# Patient Record
Sex: Male | Born: 1990 | Race: White | Hispanic: No | Marital: Single | Smoking: Never smoker
Health system: Southern US, Community
[De-identification: ages and names within clinical notes are randomized; demographics above are authoritative.]

## PROBLEM LIST (undated history)

## (undated) HISTORY — PX: WISDOM TOOTH EXTRACTION: SHX21

---
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
## 2012-08-30 ENCOUNTER — Ambulatory Visit (INDEPENDENT_AMBULATORY_CARE_PROVIDER_SITE_OTHER): Payer: Managed Care, Other (non HMO)

## 2012-08-30 ENCOUNTER — Ambulatory Visit (INDEPENDENT_AMBULATORY_CARE_PROVIDER_SITE_OTHER): Payer: Managed Care, Other (non HMO) | Admitting: Family Medicine

## 2012-08-30 DIAGNOSIS — M25539 Pain in unspecified wrist: Secondary | ICD-10-CM

## 2012-08-30 DIAGNOSIS — M25531 Pain in right wrist: Secondary | ICD-10-CM

## 2012-08-30 NOTE — Progress Notes (Signed)
  Subjective:    Patient ID: Hector Mcmillan, male    DOB: Mar 30, 1990, 22 y.o.   MRN: [id number]  HPI Wrist pain x 2 days Pt was stepping off boat when he felt pull and pop on ulnar side of R wrist.  Has had mild pain since this point.  Worse with wrist supination and ulnar deviation.  No numbness or paresthesias.  Grip strength and ROM intact.     Review of Systems  All other systems reviewed and are negative.       Objective:   Physical Exam  Constitutional: He appears well-developed and well-nourished.  HENT:  Head: Normocephalic and atraumatic.  Eyes: Conjunctivae are normal. Pupils are equal, round, and reactive to light.  Neck: Normal range of motion.  Cardiovascular: Normal rate and regular rhythm.   Pulmonary/Chest: Effort normal and breath sounds normal.  Abdominal: Soft.  Musculoskeletal:       Arms: R wrist: Full ROM Full strength  + pain with resisted ulnar deviation + pain with resisted supination No TFCC tenderness   Neurological: He is alert.  Skin: Skin is warm.   [facility] reading (PRIMARY) by  Dr. Alvester Morin R wrist xray preliminarily negative for any fracture or dislocation                                        Assessment & Plan:  Suspect ulnar sided wrist extensor sprain. No TFCC tenderness RICE and NSAIDs  Wrist brace  Discussed general and MSK red flags.  Follow up with sports medicine if not improved.

## 2012-08-30 NOTE — Patient Instructions (Signed)
Wrist Pain  Wrist injuries are frequent in adults and children. A sprain is an injury to the ligaments that hold your bones together. A strain is an injury to muscle or muscle cord-like structures (tendons) from stretching or pulling. Generally, when wrists are moderately tender to touch following a fall or injury, a break in the bone (fracture) may be present. Most wrist sprains or strains are better in 3 to 5 days, but complete healing may take several weeks.  HOME CARE INSTRUCTIONS    Put ice on the injured area.   Put ice in a plastic bag.   Place a towel between your skin and the bag.   Leave the ice on for 15-20 minutes, 3-4 times a day, for the first 2 days.   Keep your arm raised above the level of your heart whenever possible to reduce swelling and pain.   Rest the injured area for at least 48 hours or as directed by your caregiver.   If a splint or elastic bandage has been applied, use it for as long as directed by your caregiver or until seen by a caregiver for a follow-up exam.   Only take over-the-counter or prescription medicines for pain, discomfort, or fever as directed by your caregiver.   Keep all follow-up appointments. You may need to follow up with a specialist or have follow-up X-rays. Improvement in pain level is not a guarantee that you did not fracture a bone in your wrist. The only way to determine whether or not you have a broken bone is by X-ray.  SEEK IMMEDIATE MEDICAL CARE IF:    Your fingers are swollen, very red, white, or cold and blue.   Your fingers are numb or tingling.   You have increasing pain.   You have difficulty moving your fingers.  MAKE SURE YOU:    Understand these instructions.   Will watch your condition.   Will get help right away if you are not doing well or get worse.  Document Released: 11/26/2004 Document Revised: 05/11/2011 Document Reviewed: 04/09/2010  [employer] Patient Information 2014 [employer].

## 2013-03-21 ENCOUNTER — Ambulatory Visit (INDEPENDENT_AMBULATORY_CARE_PROVIDER_SITE_OTHER): Payer: 59 | Admitting: Family Medicine

## 2013-03-21 ENCOUNTER — Encounter: Payer: Self-pay | Admitting: Family Medicine

## 2013-03-21 VITALS — BP 122/73 | HR 85 | Temp 98.0°F | Ht 69.0 in | Wt 172.6 lb

## 2013-03-21 DIAGNOSIS — F524 Premature ejaculation: Secondary | ICD-10-CM

## 2013-03-21 DIAGNOSIS — F411 Generalized anxiety disorder: Secondary | ICD-10-CM

## 2013-03-21 MED ORDER — SERTRALINE HCL 50 MG PO TABS: 50.0000 mg | ORAL_TABLET | Freq: Every day | ORAL | Status: DC

## 2013-03-21 NOTE — Progress Notes (Signed)
   Subjective:    Patient ID: Hector Mcmillan, male    DOB: [birth year], 23 y.o.   MRN: [id number]  HPI This 23 y.o. male presents for evaluation of GAD sx's and premature ejaculation.   Review of Systems No chest pain, SOB, HA, dizziness, vision change, N/V, diarrhea, constipation, dysuria, urinary urgency or frequency, myalgias, arthralgias or rash.     Objective:   Physical Exam  Vital signs noted  Well developed well nourished male.  HEENT - Head atraumatic Normocephalic                Eyes - PERRLA, Conjuctiva - clear Sclera- Clear EOMI Respiratory - Lungs CTA bilateral Cardiac - RRR S1 and S2 without murmur GI - Abdomen soft Nontender and bowel sounds active x 4 Extremities - No edema. Neuro - Grossly intact.      Assessment & Plan:  GAD (generalized anxiety disorder) - Plan: sertraline (ZOLOFT) 50 MG tablet Po qd #30w/5rf.  Follow up in one month.  Premature ejaculation - Sertraline 50mg  one po qd #30/5rf.  Deatra CanterWilliam J Demetruis Depaul FNP

## 2013-03-21 NOTE — Patient Instructions (Signed)

## 2013-03-27 ENCOUNTER — Ambulatory Visit: Payer: Managed Care, Other (non HMO) | Admitting: Family Medicine

## 2013-04-03 ENCOUNTER — Encounter: Payer: Self-pay | Admitting: *Deleted

## 2013-04-20 ENCOUNTER — Ambulatory Visit: Payer: 59 | Admitting: Family Medicine

## 2013-04-21 ENCOUNTER — Ambulatory Visit: Payer: 59 | Admitting: Family Medicine

## 2013-05-31 ENCOUNTER — Ambulatory Visit (INDEPENDENT_AMBULATORY_CARE_PROVIDER_SITE_OTHER): Payer: 59 | Admitting: Family Medicine

## 2013-05-31 VITALS — BP 120/75 | HR 93 | Temp 98.7°F | Ht 69.0 in | Wt 173.4 lb

## 2013-05-31 DIAGNOSIS — F411 Generalized anxiety disorder: Secondary | ICD-10-CM

## 2013-05-31 MED ORDER — PAROXETINE HCL 20 MG PO TABS: 20.0000 mg | ORAL_TABLET | Freq: Every day | ORAL | Status: DC

## 2013-05-31 NOTE — Progress Notes (Signed)
   Subjective:    Patient ID: Tillman AbideSteven B Fleet, male    DOB: November 29, 1990, 23 y.o.   MRN: [id number]  HPI This 23 y.o. male presents for evaluation of GAD sx's and premature ejaculation. He was taking zoloft and it is not working  Review of Systems No chest pain, SOB, HA, dizziness, vision change, N/V, diarrhea, constipation, dysuria, urinary urgency or frequency, myalgias, arthralgias or rash.     Objective:   Physical Exam Vital signs noted  Well developed well nourished male.  HEENT - Head atraumatic Normocephalic                Eyes - PERRLA, Conjuctiva - clear Sclera- Clear EOMI                Ears - EAC's Wnl TM's Wnl Gross Hearing WNL                Nose - Nares patent                 Throat - oropharanx wnl Respiratory - Lungs CTA bilateral Cardiac - RRR S1 and S2 without murmur GI - Abdomen soft Nontender and bowel sounds active x 4 Extremities - No edema..  Neuro - Grossly intact.       Assessment & Plan:  GAD (generalized anxiety disorder) - Plan: PARoxetine (PAXIL) 20 MG tablet Deatra CanterWilliam J Oxford FNP

## 2013-06-05 ENCOUNTER — Telehealth: Payer: Self-pay | Admitting: Family Medicine

## 2013-06-05 ENCOUNTER — Ambulatory Visit: Payer: 59 | Admitting: Family Medicine

## 2013-06-05 NOTE — Telephone Encounter (Signed)
appt given for today at 2:20

## 2013-06-30 ENCOUNTER — Ambulatory Visit: Payer: 59 | Admitting: Family Medicine

## 2013-12-28 ENCOUNTER — Telehealth: Payer: Self-pay | Admitting: Family Medicine

## 2013-12-28 NOTE — Telephone Encounter (Signed)
STP he has had burning with urination x 1 week, the burning is towards the end of urinating, pt declines any foul odor or blood in the urine, no other symptoms, pt declines earlier appts, he requested Monday morning. Pt given appt Monday morning with Ander SladeBill Oxford at 8:45

## 2014-01-01 ENCOUNTER — Ambulatory Visit: Payer: Self-pay | Admitting: Family Medicine

## 2014-01-08 ENCOUNTER — Encounter: Payer: Self-pay | Admitting: Family Medicine

## 2014-01-08 ENCOUNTER — Ambulatory Visit (INDEPENDENT_AMBULATORY_CARE_PROVIDER_SITE_OTHER): Payer: 59 | Admitting: Family Medicine

## 2014-01-08 VITALS — BP 133/81 | HR 102 | Temp 96.9°F | Ht 69.0 in | Wt 182.0 lb

## 2014-01-08 DIAGNOSIS — R300 Dysuria: Secondary | ICD-10-CM

## 2014-01-08 DIAGNOSIS — B029 Zoster without complications: Secondary | ICD-10-CM

## 2014-01-08 DIAGNOSIS — N390 Urinary tract infection, site not specified: Secondary | ICD-10-CM

## 2014-01-08 DIAGNOSIS — Z139 Encounter for screening, unspecified: Secondary | ICD-10-CM

## 2014-01-08 LAB — POCT UA - MICROSCOPIC ONLY
BACTERIA, U MICROSCOPIC: NEGATIVE
Casts, Ur, LPF, POC: NEGATIVE
Crystals, Ur, HPF, POC: NEGATIVE
Mucus, UA: NEGATIVE
Yeast, UA: NEGATIVE

## 2014-01-08 LAB — POCT WET PREP (WET MOUNT)

## 2014-01-08 LAB — POCT URINALYSIS DIPSTICK
BILIRUBIN UA: NEGATIVE
GLUCOSE UA: NEGATIVE
Ketones, UA: NEGATIVE
NITRITE UA: NEGATIVE
Protein, UA: NEGATIVE
Spec Grav, UA: 1.01
Urobilinogen, UA: NEGATIVE
pH, UA: 5

## 2014-01-08 MED ORDER — DOXYCYCLINE HYCLATE 100 MG PO TABS: 100.0000 mg | ORAL_TABLET | Freq: Two times a day (BID) | ORAL | Status: DC

## 2014-01-08 NOTE — Patient Instructions (Addendum)
Drink lots of fluids, especially water Avoid soaps fabric softeners and detergents that have a lot of fragrance Take antibiotic as directed Take antibiotic as directed

## 2014-01-08 NOTE — Progress Notes (Signed)
Subjective:    Patient ID: Hector Mcmillan, male    DOB: 1990/04/19, 23 y.o.   MRN: [id number]  HPI Pt here for burning with urination x 3-4 weeks.the patient has no history of kidney stones nor is there any family history of kidney stones. He denies abdominal pain and back pain. He denies any swelling in the testicles. This burning is worse before going to bed in the morning after working third shift. It is not there all the time. He is sexually active. He denies any discharge.   Review of Systems  Constitutional: Negative.   HENT: Negative.   Eyes: Negative.   Respiratory: Negative.   Cardiovascular: Negative.   Gastrointestinal: Negative.   Endocrine: Negative.   Genitourinary: Positive for dysuria.  Musculoskeletal: Negative.   Skin: Negative.   Allergic/Immunologic: Negative.   Neurological: Negative.   Hematological: Negative.   Psychiatric/Behavioral: Negative.          There are no active problems to display for this patient.  Outpatient Encounter Prescriptions as of 01/08/2014  Medication Sig  . PARoxetine (PAXIL) 20 MG tablet Take 1 tablet (20 mg total) by mouth daily.       Objective:   Physical Exam  Constitutional: He is oriented to person, place, and time. He appears well-developed and well-nourished. No distress.  HENT:  Head: Normocephalic.  Eyes: Conjunctivae and EOM are normal. Pupils are equal, round, and reactive to light. Right eye exhibits no discharge. Left eye exhibits no discharge. No scleral icterus.  Neck: Normal range of motion.  Abdominal: Soft. Bowel sounds are normal. He exhibits no distension and no mass. There is no tenderness. There is no rebound and no guarding.  Genitourinary: Rectum normal and penis normal. No penile tenderness.  The external genitalia were normal without swelling or tenderness. There were no inguinal nodes. Rectal exam had a slightly enlarged and congested prostate. There were no prostate masses and no rectal masses.    Musculoskeletal: Normal range of motion.  Neurological: He is alert and oriented to person, place, and time.  Skin: Skin is warm and dry. No rash noted. No erythema. No pallor.  Psychiatric: He has a normal mood and affect. His behavior is normal. Judgment and thought content normal.    BP 133/81 mmHg  Pulse 102  Temp(Src) 96.9 F (36.1 C) (Oral)  Ht 5\' 9"  (1.753 m)  Wt 182 lb (82.555 kg)  BMI 26.86 kg/m2  Results for orders placed or performed in visit on 01/08/14  POCT urinalysis dipstick  Result Value Ref Range   Color, UA yellow    Clarity, UA clear    Glucose, UA neg    Bilirubin, UA neg    Ketones, UA neg    Spec Grav, UA 1.010    Blood, UA trace    pH, UA 5.0    Protein, UA neg    Urobilinogen, UA negative    Nitrite, UA neg    Leukocytes, UA Trace   POCT UA - Microscopic Only  Result Value Ref Range   WBC, Ur, HPF, POC 5-10    RBC, urine, microscopic 5-10    Bacteria, U Microscopic neg    Mucus, UA neg    Epithelial cells, urine per micros occ    Crystals, Ur, HPF, POC neg    Casts, Ur, LPF, POC neg    Yeast, UA neg   POCT Wet Prep [facility]  Result Value Ref Range   WBC, Wet Prep HPF POC 1-3  Bacteria Wet Prep HPF POC occ    Clue Cells Wet Prep HPF POC None    Yeast Wet Prep HPF POC None    Trichomonas Wet Prep HPF POC none    The urine will also be screened for chlamydia and GC.--- The patient was made aware of this.       Assessment & Plan:   1. Dysuria - POCT urinalysis dipstick - POCT UA - Microscopic Only - Urine culture - doxycycline (VIBRA-TABS) 100 MG tablet; Take 1 tablet (100 mg total) by mouth 2 (two) times daily.  Dispense: 60 tablet; Refill: 0 - POCT Wet Prep [facility]([place]  2. UTI (lower urinary tract infection) - doxycycline (VIBRA-TABS) 100 MG tablet; Take 1 tablet (100 mg total) by mouth 2 (two) times daily.  Dispense: 60 tablet; Refill: 0 - POCT Wet Prep [employer]([place])  3. Screening  4. Herpes zoster  Patient Instructions   Drink lots of fluids, especially water Avoid soaps fabric softeners and detergents that have a lot of fragrance Take antibiotic as directed Take antibiotic as directed   Nyra Capeson W. Moore MD

## 2014-01-08 NOTE — Addendum Note (Signed)
Addended by: Orma RenderHODGES, Elizandro Laura F on: 01/08/2014 11:39 AM   Modules accepted: Orders

## 2014-01-09 LAB — URINE CULTURE: ORGANISM ID, BACTERIA: NO GROWTH

## 2014-01-09 LAB — HEPATITIS C ANTIBODY: Hep C Virus Ab: <0.1 s/co ratio (ref 0.0–0.9)

## 2014-01-09 LAB — HIV ANTIBODY (ROUTINE TESTING W REFLEX)
HIV 1/O/2 Abs-Index Value: <1 (ref <1.00)
HIV-1/HIV-2 Ab: NONREACTIVE

## 2014-01-09 LAB — HERPES SIMPLEX TYPING

## 2014-01-10 LAB — SPECIMEN STATUS REPORT

## 2014-01-11 ENCOUNTER — Telehealth: Payer: Self-pay | Admitting: *Deleted

## 2014-01-11 LAB — GC/CHLAMYDIA PROBE AMP
Chlamydia trachomatis, NAA: POSITIVE — AB
Neisseria gonorrhoeae by PCR: NEGATIVE

## 2014-01-11 LAB — HSV 2 ANTIBODY, IGG: HSV 2 IGG,TYPE SPECIFIC AB: <0.91 index (ref 0.00–0.90)

## 2014-01-11 LAB — HSV 1 AND 2 IGM ABS, INDIRECT: HSV 1 IgM: <1:10 {titer}

## 2014-01-11 LAB — SPECIMEN STATUS REPORT

## 2014-01-11 NOTE — Telephone Encounter (Signed)
Patient has no vm.  Please call to discuss lab results .

## 2014-01-11 NOTE — Telephone Encounter (Signed)
Aware of results. 

## 2014-01-11 NOTE — Telephone Encounter (Signed)
-----   Message from Ernestina Pennaonald W Moore, MD sent at 01/11/2014  7:13 AM EST ----- Please call the patient with this information and we will discuss everything in full when he returns to the office

## 2014-01-11 NOTE — Progress Notes (Signed)
Patient aware.

## 2014-01-11 NOTE — Telephone Encounter (Signed)
-----   Message from Ernestina Pennaonald W Moore, MD sent at 01/11/2014  7:12 AM EST ----- The additional cultures that have come back showing one that is positive for chlamydia and negative for gonorrhea.The antibiotic that has been prescribed, doxycycline, should be taken until it is completed and this should take care of the chlamydia that was found on the culture

## 2014-02-05 ENCOUNTER — Ambulatory Visit: Payer: 59 | Admitting: Family Medicine

## 2014-02-16 ENCOUNTER — Telehealth: Payer: Self-pay | Admitting: Family Medicine

## 2014-02-19 NOTE — Telephone Encounter (Signed)
Appointment given for 4:00 tomorrow with Christell ConstantMoore.

## 2014-02-20 ENCOUNTER — Encounter: Payer: Self-pay | Admitting: Family Medicine

## 2014-02-20 ENCOUNTER — Ambulatory Visit (INDEPENDENT_AMBULATORY_CARE_PROVIDER_SITE_OTHER): Payer: 59 | Admitting: Family Medicine

## 2014-02-20 VITALS — BP 148/89 | HR 84 | Temp 97.0°F | Ht 69.0 in | Wt 180.0 lb

## 2014-02-20 DIAGNOSIS — R300 Dysuria: Secondary | ICD-10-CM

## 2014-02-20 LAB — POCT UA - MICROSCOPIC ONLY
CRYSTALS, UR, HPF, POC: NEGATIVE
Casts, Ur, LPF, POC: NEGATIVE
Mucus, UA: NEGATIVE
RBC, URINE, MICROSCOPIC: NEGATIVE
WBC, Ur, HPF, POC: NEGATIVE
Yeast, UA: NEGATIVE

## 2014-02-20 LAB — POCT URINALYSIS DIPSTICK
Bilirubin, UA: NEGATIVE
Blood, UA: NEGATIVE
GLUCOSE UA: NEGATIVE
KETONES UA: NEGATIVE
Leukocytes, UA: NEGATIVE
Nitrite, UA: NEGATIVE
Protein, UA: NEGATIVE
Spec Grav, UA: <=1.005
Urobilinogen, UA: NEGATIVE
pH, UA: 7.5

## 2014-02-20 NOTE — Progress Notes (Signed)
Subjective:    Patient ID: Hector Mcmillan, male    DOB: 06-04-1990, 23 y.o.   MRN: [id number]  HPI Patient here today for 4-6 week follow up on dysuria and pressure. The most recent lab work results will be reviewed with the patient today. He is much better and the urine results for today is clear and I will be discussed with him when I go into the room. All the patient's lab work was reviewed with him and questions were answered regarding the results. He still is having some very minimal tip of the penis dysuria at times. He thinks it may be more of a mental thing than an actual physical thing.        There are no active problems to display for this patient.  Outpatient Encounter Prescriptions as of 02/20/2014  Medication Sig  . PARoxetine (PAXIL) 20 MG tablet Take 1 tablet (20 mg total) by mouth daily.  . [DISCONTINUED] doxycycline (VIBRA-TABS) 100 MG tablet Take 1 tablet (100 mg total) by mouth 2 (two) times daily.    Review of Systems  Constitutional: Negative.   HENT: Negative.   Eyes: Negative.   Respiratory: Negative.   Cardiovascular: Negative.   Gastrointestinal: Negative.   Endocrine: Negative.   Genitourinary: Positive for dysuria (burn / itch at times) and frequency.  Musculoskeletal: Negative.   Skin: Negative.   Allergic/Immunologic: Negative.   Neurological: Negative.   Hematological: Negative.   Psychiatric/Behavioral: Negative.        Objective:   Physical Exam  Constitutional: He is oriented to person, place, and time. He appears well-developed and well-nourished. No distress.  Abdominal: Soft. Bowel sounds are normal. There is no tenderness. There is no rebound and no guarding.  Genitourinary: Rectum normal, prostate normal and penis normal.  Musculoskeletal: Normal range of motion.  Neurological: He is alert and oriented to person, place, and time.  Skin: Skin is warm and dry. No rash noted. No erythema.  Psychiatric: He has a normal mood and affect.  His behavior is normal. Judgment and thought content normal.   BP 148/89 mmHg  Pulse 84  Temp(Src) 97 F (36.1 C) (Oral)  Ht 5\' 9"  (1.753 m)  Wt 180 lb (81.647 kg)  BMI 26.57 kg/m2  Results for orders placed or performed in visit on 02/20/14  POCT UA - Microscopic Only  Result Value Ref Range   WBC, Ur, HPF, POC neg    RBC, urine, microscopic neg    Bacteria, U Microscopic mod    Mucus, UA neg    Epithelial cells, urine per micros few    Crystals, Ur, HPF, POC neg    Casts, Ur, LPF, POC neg    Yeast, UA neg   POCT urinalysis dipstick  Result Value Ref Range   Color, UA gold    Clarity, UA clear    Glucose, UA neg    Bilirubin, UA neg    Ketones, UA neg    Spec Grav, UA <=1.005    Blood, UA neg    pH, UA 7.5    Protein, UA neg    Urobilinogen, UA negative    Nitrite, UA neg    Leukocytes, UA Negative           Assessment & Plan:  1. Dysuria - POCT UA - Microscopic Only - POCT urinalysis dipstick  Patient Instructions  Try to drink more fluids, water is best Avoid sodium in your diet as much as possible Check blood pressures when  available outside the office and when you come by in 4-6 weeks to give us a urine specimen and bring those blood pressure readings with you and give them to my nurse   Nyra Capeson W. Doralene Glanz MD

## 2014-02-20 NOTE — Patient Instructions (Signed)
Try to drink more fluids, water is best Avoid sodium in your diet as much as possible Check blood pressures when available outside the office and when you come by in 4-6 weeks to give us a urine specimen and bring those blood pressure readings with you and give them to my nurse

## 2014-03-12 ENCOUNTER — Ambulatory Visit: Payer: 59 | Admitting: Family Medicine

## 2014-03-19 ENCOUNTER — Telehealth: Payer: Self-pay | Admitting: Family Medicine

## 2014-03-19 NOTE — Telephone Encounter (Signed)
Patient only needs labs. He can come by and leave urine specimen.

## 2014-03-20 ENCOUNTER — Other Ambulatory Visit (INDEPENDENT_AMBULATORY_CARE_PROVIDER_SITE_OTHER): Payer: 59

## 2014-03-20 DIAGNOSIS — N390 Urinary tract infection, site not specified: Secondary | ICD-10-CM

## 2014-03-20 LAB — POCT URINALYSIS DIPSTICK
Bilirubin, UA: NEGATIVE
GLUCOSE UA: 5
Ketones, UA: NEGATIVE
LEUKOCYTES UA: NEGATIVE
NITRITE UA: NEGATIVE
PH UA: 5
Protein, UA: NEGATIVE
Spec Grav, UA: >=1.03
Urobilinogen, UA: NEGATIVE

## 2014-03-20 LAB — POCT UA - MICROSCOPIC ONLY
Bacteria, U Microscopic: NEGATIVE
Casts, Ur, LPF, POC: NEGATIVE
Crystals, Ur, HPF, POC: NEGATIVE
MUCUS UA: NEGATIVE
WBC, UR, HPF, POC: NEGATIVE
Yeast, UA: NEGATIVE

## 2014-03-20 NOTE — Progress Notes (Signed)
Lab only 

## 2014-03-20 NOTE — Telephone Encounter (Signed)
Pt came into office today and went to the lab, will close encounter.

## 2014-03-21 ENCOUNTER — Telehealth: Payer: Self-pay | Admitting: Family Medicine

## 2014-03-21 NOTE — Telephone Encounter (Signed)
Patient advised that test will be added to urine specimen.

## 2014-03-22 LAB — URINE CULTURE: Organism ID, Bacteria: NO GROWTH

## 2014-03-22 LAB — SPECIMEN STATUS REPORT

## 2014-03-26 ENCOUNTER — Telehealth: Payer: Self-pay | Admitting: Family Medicine

## 2014-03-26 NOTE — Telephone Encounter (Signed)
Patient aware results are not back yet and we will call as soon as they come in.

## 2014-03-27 ENCOUNTER — Telehealth: Payer: Self-pay | Admitting: Family Medicine

## 2014-03-27 NOTE — Telephone Encounter (Signed)
Pt aware - results not back yet  

## 2014-03-28 ENCOUNTER — Other Ambulatory Visit: Payer: Self-pay | Admitting: Family Medicine

## 2014-03-29 NOTE — Telephone Encounter (Signed)
Last sen 02/20/14 DW[facility]  This med not on EPIC list

## 2014-03-29 NOTE — Telephone Encounter (Signed)
This is okay to refill 1 

## 2014-08-30 ENCOUNTER — Ambulatory Visit (INDEPENDENT_AMBULATORY_CARE_PROVIDER_SITE_OTHER): Payer: Self-pay | Admitting: Otolaryngology

## 2015-08-22 ENCOUNTER — Ambulatory Visit: Payer: 59 | Admitting: Physician Assistant

## 2015-08-23 ENCOUNTER — Encounter: Payer: Self-pay | Admitting: Family Medicine

## 2015-08-26 ENCOUNTER — Ambulatory Visit (INDEPENDENT_AMBULATORY_CARE_PROVIDER_SITE_OTHER): Payer: 59 | Admitting: Physician Assistant

## 2015-08-26 ENCOUNTER — Encounter: Payer: Self-pay | Admitting: Physician Assistant

## 2015-08-26 VITALS — BP 117/86 | HR 77 | Temp 98.0°F | Ht 69.0 in | Wt 190.0 lb

## 2015-08-26 DIAGNOSIS — F419 Anxiety disorder, unspecified: Secondary | ICD-10-CM

## 2015-08-26 MED ORDER — ESCITALOPRAM OXALATE 10 MG PO TABS: 10.0000 mg | ORAL_TABLET | Freq: Every day | ORAL | Status: DC

## 2015-08-26 NOTE — Progress Notes (Signed)
Subjective:     Patient ID: Hector Mcmillan, male   DOB: 09-19-1990, 25 y.o.   MRN: [id number]  HPI Pt here due to worsening anxiety/depression Hx of same No meds x 25month Prev tried on Zoloft and Paxil which he states did not really work He has anxiety, excessive worry, and panic attacks Having attacks 3-4 times per week with assoc hypervent Also has noted loss in interest in his regular activities + ETOH use of 2 40oz per night + FH of anxiety in father and grandmother  Review of Systems  Constitutional: Negative.   Psychiatric/Behavioral: Positive for sleep disturbance and decreased concentration. Negative for suicidal ideas, confusion, self-injury and agitation. The patient is nervous/anxious and is hyperactive.        Objective:   Physical Exam  Nursing note and vitals reviewed. Pt relaxed during exam Good eye contact and interaction Mood approp and answers all questions approp 20 min face to face with pt     Assessment:     1. Anxiety        Plan:     Trail of Lexapro10mg  1/2 po qd x 6 days and increase to full tab SE reviewed Would like for him to decrease ETOH use Stressed importance of regular f/u F/U in 2-3 weeks

## 2015-08-26 NOTE — Patient Instructions (Signed)

## 2015-09-06 ENCOUNTER — Emergency Department (HOSPITAL_BASED_OUTPATIENT_CLINIC_OR_DEPARTMENT_OTHER)
Admission: EM | Admit: 2015-09-06 | Discharge: 2015-09-06 | Disposition: A | Discharge status: Home / Self Care | Payer: Worker's Compensation | Attending: Emergency Medicine | Admitting: Emergency Medicine

## 2015-09-06 ENCOUNTER — Encounter (HOSPITAL_BASED_OUTPATIENT_CLINIC_OR_DEPARTMENT_OTHER): Payer: Self-pay

## 2015-09-06 DIAGNOSIS — W260XXA Contact with knife, initial encounter: Secondary | ICD-10-CM | POA: Diagnosis not present

## 2015-09-06 DIAGNOSIS — Y929 Unspecified place or not applicable: Secondary | ICD-10-CM | POA: Diagnosis not present

## 2015-09-06 DIAGNOSIS — Y999 Unspecified external cause status: Secondary | ICD-10-CM | POA: Diagnosis not present

## 2015-09-06 DIAGNOSIS — S71112A Laceration without foreign body, left thigh, initial encounter: Secondary | ICD-10-CM | POA: Insufficient documentation

## 2015-09-06 DIAGNOSIS — Y9389 Activity, other specified: Secondary | ICD-10-CM | POA: Diagnosis not present

## 2015-09-06 MED ORDER — LIDOCAINE-EPINEPHRINE 2 %-1:100000 IJ SOLN
INTRAMUSCULAR | Status: AC
  Filled 2015-09-06: qty 1

## 2015-09-06 MED ORDER — LIDOCAINE-EPINEPHRINE (PF) 2 %-1:200000 IJ SOLN: 10.0000 mL | Freq: Once | INTRAMUSCULAR | Status: DC

## 2015-09-06 MED ORDER — LIDOCAINE-EPINEPHRINE (PF) 2 %-1:200000 IJ SOLN
20.0000 mL | Freq: Once | INTRAMUSCULAR | Status: AC
  Administered 2015-09-06: 20 mL

## 2015-09-06 NOTE — ED Notes (Signed)
Pt was trying to cut a plastic cap off and the knife slipped and cut his left thigh instead.  2 Inch gaping laceration with smooth edges, bleeding controlled in triage, TDAP up to date, pt ambulatory without difficulty.

## 2015-09-06 NOTE — ED Notes (Signed)
Pt made aware to return if symptoms worsen or if any life threatening symptoms occur.   

## 2015-09-06 NOTE — Discharge Instructions (Signed)
Be sure to get your blood pressure rechecked by your primary care doctor

## 2015-09-06 NOTE — ED Provider Notes (Signed)
CSN: [id number]     Arrival date & time 09/06/15  [id number] History   First MD Initiated Contact with Patient 09/06/15 0701     Chief Complaint  Patient presents with  . Laceration     (Consider location/radiation/quality/duration/timing/severity/associated sxs/prior Treatment) HPI  25 year old male presents with a laceration to his left medial thigh that occurred about 45 minutes ago. Patient was using a razor knife trying to take a plastic Off and the knife went through the And then slipped and cut his left side. Bleeding controlled. Patient denies any pain. Last tetanus immunization was 4 years ago. No weakness or numbness. No significant past medical problems. The blade is intact and did not break  History reviewed. No pertinent past medical history. Past Surgical History  Procedure Laterality Date  . Wisdom tooth extraction Bilateral    Family History  Problem Relation Age of Onset  . Diabetes Father   . Hyperlipidemia Father    Social History  Substance Use Topics  . Smoking status: Never Smoker   . Smokeless tobacco: None  . Alcohol Use: Yes     Comment: occasional    Review of Systems  Skin: Positive for wound.  Neurological: Negative for weakness and numbness.      Allergies  Review of patient's allergies indicates no known allergies.  Home Medications   Prior to Admission medications   Medication Sig Start Date End Date Taking? Authorizing Provider  escitalopram (LEXAPRO) 10 MG tablet Take 1 tablet (10 mg total) by mouth daily. 08/26/15   Jerilee FieldWilliam L Webster, PA-C   BP 159/103 mmHg  Pulse 99  Temp(Src) 97.8 F (36.6 C) (Oral)  Resp 18  Ht 5\' 9"  (1.753 m)  Wt 180 lb (81.647 kg)  BMI 26.57 kg/m2  SpO2 97% Physical Exam  Constitutional: He is oriented to person, place, and time. He appears well-developed and well-nourished.  HENT:  Head: Normocephalic and atraumatic.  Right Ear: External ear normal.  Left Ear: External ear normal.  Nose: Nose normal.    Eyes: Right eye exhibits no discharge. Left eye exhibits no discharge.  Neck: Neck supple.  Pulmonary/Chest: Effort normal.  Abdominal: Soft. He exhibits no distension.  Musculoskeletal: He exhibits no edema.       Left upper leg: He exhibits laceration. He exhibits no tenderness, no bony tenderness and no swelling.       Legs: Neurological: He is alert and oriented to person, place, and time.  Skin: Skin is warm and dry.  Nursing note and vitals reviewed.   ED Course  .Marland Kitchen.Laceration Repair Date/Time: 09/06/2015 7:35 AM Performed by: Pricilla LovelessGOLDSTON, Ancel Easler Authorized by: Pricilla LovelessGOLDSTON, Kemba Hoppes Consent: Verbal consent obtained. Risks and benefits: risks, benefits and alternatives were discussed Consent given by: patient Body area: lower extremity Location details: left upper leg Laceration length: 3.5 cm Foreign bodies: no foreign bodies Tendon involvement: none Nerve involvement: none Vascular damage: no Anesthesia: local infiltration Local anesthetic: lidocaine 2% with epinephrine Anesthetic total: 8 ml Patient sedated: no Preparation: Patient was prepped and draped in the usual sterile fashion. Irrigation solution: saline Irrigation method: syringe Amount of cleaning: extensive Debridement: none Degree of undermining: none Skin closure: 4-0 Prolene Number of sutures: 7 Technique: simple Approximation: close Approximation difficulty: simple Dressing: 4x4 sterile gauze and antibiotic ointment Patient tolerance: Patient tolerated the procedure well with no immediate complications   (including critical care time) Labs Review Labs Reviewed - No data to display  Imaging Review No results found. I have personally reviewed and evaluated these  images and lab results as part of my medical decision-making.   EKG Interpretation None      MDM   Final diagnoses:  Thigh laceration, left, initial encounter    Superficial laceration as above. Irrigated, cleaned and repaired without  difficulty. Appears NV intact. Instructed on wound care and return precaution. F/u with PCP for recheck of BP and suture removal.    Pricilla LovelessScott Brianah Hopson, MD 09/06/15 [phone]

## 2015-09-09 ENCOUNTER — Ambulatory Visit (INDEPENDENT_AMBULATORY_CARE_PROVIDER_SITE_OTHER): Payer: Self-pay | Admitting: Family

## 2015-09-09 ENCOUNTER — Encounter: Payer: Self-pay | Admitting: Family

## 2015-09-09 ENCOUNTER — Ambulatory Visit (INDEPENDENT_AMBULATORY_CARE_PROVIDER_SITE_OTHER): Payer: Self-pay

## 2015-09-09 VITALS — BP 133/86 | HR 88 | Temp 98.2°F | Ht 69.0 in | Wt 196.4 lb

## 2015-09-09 DIAGNOSIS — S43402A Unspecified sprain of left shoulder joint, initial encounter: Secondary | ICD-10-CM

## 2015-09-09 DIAGNOSIS — S4992XA Unspecified injury of left shoulder and upper arm, initial encounter: Secondary | ICD-10-CM

## 2015-09-09 DIAGNOSIS — Y92099 Unspecified place in other non-institutional residence as the place of occurrence of the external cause: Secondary | ICD-10-CM

## 2015-09-09 DIAGNOSIS — W01118A Fall on same level from slipping, tripping and stumbling with subsequent striking against other sharp object, initial encounter: Secondary | ICD-10-CM

## 2015-09-09 DIAGNOSIS — Y92009 Unspecified place in unspecified non-institutional (private) residence as the place of occurrence of the external cause: Secondary | ICD-10-CM

## 2015-09-09 DIAGNOSIS — W19XXXA Unspecified fall, initial encounter: Secondary | ICD-10-CM

## 2015-09-09 MED ORDER — NAPROXEN 500 MG PO TABS
500.0000 mg | ORAL_TABLET | Freq: Two times a day (BID) | ORAL | Status: DC
Start: 2015-09-09 — End: 2016-10-10

## 2015-09-09 MED ORDER — CYCLOBENZAPRINE HCL 10 MG PO TABS: 10.0000 mg | ORAL_TABLET | Freq: Three times a day (TID) | ORAL | Status: DC | PRN

## 2015-09-09 MED ORDER — KETOROLAC TROMETHAMINE 60 MG/2ML IM SOLN
60.0000 mg | Freq: Once | INTRAMUSCULAR | Status: AC
  Administered 2015-09-09: 60 mg via INTRAMUSCULAR

## 2015-09-09 MED ORDER — METHYLPREDNISOLONE ACETATE 80 MG/ML IJ SUSP
80.0000 mg | Freq: Once | INTRAMUSCULAR | Status: AC
  Administered 2015-09-09: 80 mg via INTRAMUSCULAR

## 2015-09-09 NOTE — Patient Instructions (Signed)
Shoulder Sprain °A shoulder sprain is a partial or complete tear in one of the tough, fiber-like tissues (ligaments) in the shoulder. The ligaments in the shoulder help to hold the shoulder in place. °CAUSES °This condition may be caused by: °· A fall. °· A hit to the shoulder. °· A twist of the arm. °RISK FACTORS °This condition is more likely to develop in: °· People who play sports. °· People who have problems with balance or coordination. °SYMPTOMS °Symptoms of this condition include: °· Pain when moving the shoulder. °· Limited ability to move the shoulder. °· Swelling and tenderness on top of the shoulder. °· Warmth in the shoulder. °· A change in the shape of the shoulder. °· Redness or bruising on the shoulder. °DIAGNOSIS °This condition is diagnosed with a physical exam. During the exam, you may be asked to do simple exercises with your shoulder. You may also have imaging tests, such as X-rays, MRI, or a CT scan. These tests can show how severe the sprain is. °TREATMENT °This condition may be treated with: °· Rest. °· Pain medicine. °· Ice. °· A sling or brace. This is used to keep the arm still while the shoulder is healing. °· Physical therapy or rehabilitation exercises. These help to improve the range of motion and strength of the shoulder. °· Surgery (rare). Surgery may be needed if the sprain caused a joint to become unstable. Surgery may also be needed to reduce pain. °Some people may develop ongoing shoulder pain or lose some range of motion in the shoulder. However, most people do not develop long-term problems. °HOME CARE INSTRUCTIONS °· Rest. °· Take over-the-counter and prescription medicines only as told by your health care provider. °· If directed, apply ice to the area: °¨ Put ice in a plastic bag. °¨ Place a towel between your skin and the bag. °¨ Leave the ice on for 20 minutes, 2-3 times per day. °· If you were given a shoulder sling or brace: °¨ Wear it as told. °¨ Remove it to shower or  bathe. °¨ Move your arm only as much as told by your health care provider, but keep your hand moving to prevent swelling. °· If you were shown how to do any exercises, do them as told by your health care provider. °· Keep all follow-up visits as told by your health care provider. This is important. °SEEK MEDICAL CARE IF: °· Your pain gets worse. °· Your pain is not relieved with medicines. °· You have increased redness or swelling. °SEEK IMMEDIATE MEDICAL CARE IF: °· You have a fever. °· You cannot move your arm or shoulder. °· You develop numbness or tingling in your arms, hands, or fingers. °  °This information is not intended to replace advice given to you by your health care provider. Make sure you discuss any questions you have with your health care provider. °  °Document Released: 07/05/2008 Document Revised: 11/07/2014 Document Reviewed: 06/11/2014 °Elsevier Interactive Patient Education ©2016 [employer] ° °

## 2015-09-09 NOTE — Progress Notes (Signed)
Subjective:    Patient ID: Hector Mcmillan, male    DOB: [birth year], 27 y.o.   MRN: [id number]  Shoulder Injury  The incident occurred in the yard. The left shoulder is affected. The incident occurred 2 days ago. The injury mechanism was a fall. The quality of the pain is described as aching. The pain does not radiate. The pain is at a severity of 8/10. The pain is moderate. Pertinent negatives include no chest pain, muscle weakness, numbness or tingling. The symptoms are aggravated by movement and overhead lifting. He has tried NSAIDs and rest for the symptoms. The treatment provided mild relief.      Review of Systems  Constitutional: Negative.   HENT: Negative.   Respiratory: Negative.   Cardiovascular: Negative.  Negative for chest pain.  Gastrointestinal: Negative.   Endocrine: Negative.   Genitourinary: Negative.   Musculoskeletal: Positive for joint swelling and arthralgias.  Neurological: Negative.  Negative for tingling and numbness.  Hematological: Negative.   Psychiatric/Behavioral: Negative.   All other systems reviewed and are negative.      Objective:   Physical Exam  Constitutional: He is oriented to person, place, and time. He appears well-developed and well-nourished. No distress.  HENT:  Head: Normocephalic.  Eyes: Pupils are equal, round, and reactive to light. Right eye exhibits no discharge. Left eye exhibits no discharge.  Neck: Normal range of motion. Neck supple. No thyromegaly present.  Cardiovascular: Normal rate, regular rhythm, normal heart sounds and intact distal pulses.   No murmur heard. Pulmonary/Chest: Effort normal and breath sounds normal. No respiratory distress. He has no wheezes.  Abdominal: Soft. Bowel sounds are normal. He exhibits no distension. There is no tenderness.  Musculoskeletal: He exhibits tenderness.  Unable to flex or abduct left arm greater than 20 degrees related to pain    Neurological: He is alert and oriented to person,  place, and time.  Skin: Skin is warm and dry. No rash noted. No erythema.  Psychiatric: He has a normal mood and affect. His behavior is normal. Judgment and thought content normal.  Vitals reviewed.    BP 133/86 mmHg  Pulse 88  Temp(Src) 98.2 F (36.8 C) (Oral)  Ht  (1.753 m)  Wt 196 lb 6.4 oz (89.086 kg)  BMI 28.99 kg/m2      Assessment & Plan:  1. Shoulder injury, left, initial encounter - DG Shoulder Left; Future - methylPREDNISolone acetate (DEPO-MEDROL) injection 80 mg; Inject 1 mL (80 mg total) into the muscle once. - ketorolac (TORADOL) injection 60 mg; Inject 2 mLs (60 mg total) into the muscle once. - naproxen (NAPROSYN) 500 MG tablet; Take 1 tablet (500 mg total) by mouth 2 (two) times daily with a meal.  Dispense: 60 tablet; Refill: 1 - cyclobenzaprine (FLEXERIL) 10 MG tablet; Take 1 tablet (10 mg total) by mouth 3 (three) times daily as needed for muscle spasms.  Dispense: 30 tablet; Refill: 0  2. Shoulder sprain, left, initial encounter - methylPREDNISolone acetate (DEPO-MEDROL) injection 80 mg; Inject 1 mL (80 mg total) into the muscle once. - ketorolac (TORADOL) injection 60 mg; Inject 2 mLs (60 mg total) into the muscle once. - naproxen (NAPROSYN) 500 MG tablet; Take 1 tablet (500 mg total) by mouth 2 (two) times daily with a meal.  Dispense: 60 tablet; Refill: 1 - cyclobenzaprine (FLEXERIL) 10 MG tablet; Take 1 tablet (10 mg total) by mouth 3 (three) times daily as needed for muscle spasms.  Dispense: 30 tablet; Refill: 0  3. Fall at home, initial encounter - methylPREDNISolone acetate (DEPO-MEDROL) injection 80 mg; Inject 1 mL (80 mg total) into the muscle once. - ketorolac (TORADOL) injection 60 mg; Inject 2 mLs (60 mg total) into the muscle once. - naproxen (NAPROSYN) 500 MG tablet; Take 1 tablet (500 mg total) by mouth 2 (two) times daily with a meal.  Dispense: 60 tablet; Refill: 1 - cyclobenzaprine (FLEXERIL) 10 MG tablet; Take 1 tablet (10 mg total) by  mouth 3 (three) times daily as needed for muscle spasms.  Dispense: 30 tablet; Refill: 0  Rest Ice and heat as needed Take naprosyn BID with food for next 5-7 days Sedation precaution discussed RTO in 2 weeks if not improved  Jannifer Rodneyhristy Hawks, FNP

## 2015-09-11 ENCOUNTER — Ambulatory Visit: Payer: Self-pay | Admitting: Family Medicine

## 2015-09-11 ENCOUNTER — Telehealth: Payer: Self-pay | Admitting: Family Medicine

## 2015-09-11 NOTE — Telephone Encounter (Signed)
Pt aware of shoulder x-ray results

## 2015-09-12 ENCOUNTER — Encounter: Payer: Self-pay | Admitting: Family Medicine

## 2015-09-16 ENCOUNTER — Encounter: Payer: Self-pay | Admitting: Family

## 2015-09-16 ENCOUNTER — Ambulatory Visit (INDEPENDENT_AMBULATORY_CARE_PROVIDER_SITE_OTHER): Payer: Worker's Compensation | Admitting: Family

## 2015-09-16 VITALS — BP 136/85 | HR 90 | Temp 98.1°F | Ht 69.0 in | Wt 197.4 lb

## 2015-09-16 DIAGNOSIS — Z4802 Encounter for removal of sutures: Secondary | ICD-10-CM

## 2015-09-16 DIAGNOSIS — S71112A Laceration without foreign body, left thigh, initial encounter: Secondary | ICD-10-CM

## 2015-09-16 DIAGNOSIS — Z5189 Encounter for other specified aftercare: Secondary | ICD-10-CM

## 2015-09-16 DIAGNOSIS — IMO0002 Reserved for concepts with insufficient information to code with codable children: Secondary | ICD-10-CM

## 2015-09-16 NOTE — Progress Notes (Signed)
   Subjective:    Patient ID: Hector Mcmillan, male    DOB: 1990-10-23, 25 y.o.   MRN: [id number]  HPI PT presents to the office today for follow up on Workers Comp injury for [employer] that occurred on 09/06/15 . PT states he was cutting some bearing at work and the razor slipped and cut his left inner thigh. PT went to the ED and had 7 sutures placed. PT states he is doing well and denies any warmth, drainage, or fever.    Review of Systems  Constitutional: Negative.   HENT: Negative.   Respiratory: Negative.   Cardiovascular: Negative.   Gastrointestinal: Negative.   Endocrine: Negative.   Genitourinary: Negative.   Musculoskeletal: Negative.   Neurological: Negative.   Hematological: Negative.   Psychiatric/Behavioral: Negative.   All other systems reviewed and are negative.      Objective:   Physical Exam  Constitutional: He is oriented to person, place, and time. He appears well-developed and well-nourished. No distress.  HENT:  Head: Normocephalic.  Eyes: Pupils are equal, round, and reactive to light.  Cardiovascular: Normal rate, regular rhythm, normal heart sounds and intact distal pulses.   No murmur heard. Pulmonary/Chest: Effort normal and breath sounds normal. No respiratory distress. He has no wheezes.  Abdominal: Soft. Bowel sounds are normal. He exhibits no distension. There is no tenderness.  Musculoskeletal: Normal range of motion. He exhibits no edema or tenderness.  Neurological: He is alert and oriented to person, place, and time.  Skin: Skin is warm and dry. No rash noted. No erythema.  7 mm laceration on left inner thigh, well approximated with no drainage   Psychiatric: He has a normal mood and affect. His behavior is normal. Judgment and thought content normal.  Vitals reviewed.  7 sutures removed, tolerated well   BP 136/85 mmHg  Pulse 90  Temp(Src) 98.1 F (36.7 C) (Oral)  Ht 5\' 9"  (1.753 m)  Wt 197 lb 6.4 oz (89.54 kg)  BMI 29.14  kg/m2     Assessment & Plan:  1. Visit for suture removal  2. Visit for wound check  3. Laceration  Keep clean and dry Report any s/s of redness, erythemas, and drainage RTO prn   Jannifer Rodneyhristy Marwan Lipe, FNP

## 2015-09-16 NOTE — Patient Instructions (Signed)

## 2016-04-23 ENCOUNTER — Other Ambulatory Visit: Payer: Self-pay | Admitting: Physician Assistant

## 2016-10-10 ENCOUNTER — Encounter: Payer: Self-pay | Admitting: Nurse Practitioner

## 2016-10-10 ENCOUNTER — Ambulatory Visit (INDEPENDENT_AMBULATORY_CARE_PROVIDER_SITE_OTHER): Payer: BLUE CROSS/BLUE SHIELD | Admitting: Nurse Practitioner

## 2016-10-10 VITALS — BP 112/70 | HR 80 | Temp 97.1°F | Ht 69.0 in | Wt 185.0 lb

## 2016-10-10 DIAGNOSIS — R059 Cough, unspecified: Secondary | ICD-10-CM

## 2016-10-10 DIAGNOSIS — R05 Cough: Secondary | ICD-10-CM

## 2016-10-10 DIAGNOSIS — J069 Acute upper respiratory infection, unspecified: Secondary | ICD-10-CM | POA: Diagnosis not present

## 2016-10-10 MED ORDER — CHLORPHEN-PE-ACETAMINOPHEN 4-10-325 MG PO TABS
1.0000 | ORAL_TABLET | Freq: Four times a day (QID) | ORAL | 0 refills | Status: DC | PRN
Start: 2016-10-10 — End: 2016-10-23

## 2016-10-10 MED ORDER — METHYLPREDNISOLONE ACETATE 80 MG/ML IJ SUSP
80.0000 mg | Freq: Once | INTRAMUSCULAR | Status: AC
  Administered 2016-10-10: 80 mg via INTRAMUSCULAR

## 2016-10-10 NOTE — Patient Instructions (Signed)

## 2016-10-10 NOTE — Progress Notes (Signed)
   Subjective:    Patient ID: Hector Mcmillan, male    DOB: 05-22-1990, 26 y.o.   MRN: [id number]  HPI Patient come sin today c/o cough and congestion with sore throat that started 4 days ago. Cough has gotten worse , and he coughed all  Nigh last night. He denies any fever.   Review of Systems  Constitutional: Positive for chills. Negative for fever.  HENT: Positive for congestion, rhinorrhea, sinus pressure and sore throat. Negative for ear pain and trouble swallowing.   Respiratory: Positive for cough. Negative for shortness of breath.   Cardiovascular: Negative.   Genitourinary: Negative.   Neurological: Negative.   Psychiatric/Behavioral: Negative.   All other systems reviewed and are negative.      Objective:   Physical Exam  Constitutional: He is oriented to person, place, and time. He appears well-developed and well-nourished. No distress.  HENT:  Right Ear: Hearing, tympanic membrane, external ear and ear canal normal.  Left Ear: Hearing, tympanic membrane, external ear and ear canal normal.  Nose: Mucosal edema and rhinorrhea present. Right sinus exhibits maxillary sinus tenderness (mild). Right sinus exhibits no frontal sinus tenderness. Left sinus exhibits maxillary sinus tenderness (mild). Left sinus exhibits no frontal sinus tenderness.  Mouth/Throat: Uvula is midline, oropharynx is clear and moist and mucous membranes are normal.  Cardiovascular: Normal rate and regular rhythm.   Pulmonary/Chest: Effort normal and breath sounds normal.  Neurological: He is alert and oriented to person, place, and time.  Skin: Skin is warm.  Psychiatric: He has a normal mood and affect. His behavior is normal. Judgment and thought content normal.   BP 112/70   Pulse 80   Temp (!) 97.1 F (36.2 C) (Oral)   Ht 5\' 9"  (1.753 m)   Wt 185 lb (83.9 kg)   BMI 27.32 kg/m      Assessment & Plan:   1. Cough   2. Viral upper respiratory tract infection    Meds ordered this encounter    Medications  . Chlorphen-PE-Acetaminophen 4-10-325 MG TABS    Sig: Take 1 tablet by mouth every 6 (six) hours as needed.    Dispense:  20 tablet    Refill:  0    Order Specific Question:   Supervising Provider    Answer:   VINCENT, CAROL L [[id number]]  . methylPREDNISolone acetate (DEPO-MEDROL) injection 80 mg   1. Take meds as prescribed 2. Use a cool mist humidifier especially during the winter months and when heat has been humid. 3. Use saline nose sprays frequently 4. Saline irrigations of the nose can be very helpful if done frequently.  * 4X daily for 1 week*  * Use of a nettie pot can be helpful with this. Follow directions with this* 5. Drink plenty of fluids 6. Keep thermostat turn down low 7.For any cough or congestion  Use plain Mucinex- regular strength or max strength is fine   * Children- consult with Pharmacist for dosing 8. For fever or aces or pains- take tylenol or ibuprofen appropriate for age and weight.  * for fevers greater than 101 orally you may alternate ibuprofen and tylenol every  3 hours.   Mary-Margaret Daphine DeutscherMartin, FNP

## 2016-10-14 ENCOUNTER — Other Ambulatory Visit: Payer: Self-pay | Admitting: Nurse Practitioner

## 2016-10-14 DIAGNOSIS — R05 Cough: Secondary | ICD-10-CM

## 2016-10-14 DIAGNOSIS — R059 Cough, unspecified: Secondary | ICD-10-CM

## 2016-10-14 MED ORDER — HYDROCODONE-HOMATROPINE 5-1.5 MG/5ML PO SYRP: 5.0000 mL | ORAL_SOLUTION | Freq: Four times a day (QID) | ORAL | 0 refills | Status: DC | PRN

## 2016-10-14 MED ORDER — AZITHROMYCIN 250 MG PO TABS: ORAL_TABLET | ORAL | 0 refills | Status: DC

## 2016-10-23 ENCOUNTER — Encounter: Payer: Self-pay | Admitting: Family Medicine

## 2016-10-23 ENCOUNTER — Ambulatory Visit (INDEPENDENT_AMBULATORY_CARE_PROVIDER_SITE_OTHER): Payer: BLUE CROSS/BLUE SHIELD | Admitting: Family Medicine

## 2016-10-23 VITALS — BP 114/70 | HR 73 | Temp 97.8°F | Ht 69.0 in | Wt 187.2 lb

## 2016-10-23 DIAGNOSIS — F419 Anxiety disorder, unspecified: Secondary | ICD-10-CM

## 2016-10-23 MED ORDER — GABAPENTIN 300 MG PO CAPS: 300.0000 mg | ORAL_CAPSULE | Freq: Three times a day (TID) | ORAL | 3 refills | Status: DC

## 2016-10-23 NOTE — Progress Notes (Signed)
   HPI  Patient presents today here to discuss anxiety.  Patient states he's had difficulty with anxiety for years. He's tried multiple medications including Lexapro, Zoloft, and Prozac. He denies suicidal thoughts.  He states he has multiple stressful situations in his life currently,. 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 He denies alcohol use. He also denies any benzodiazepine use previously.  PMH: Smoking status noted ROS: Per HPI  Objective: BP 114/70   Pulse 73   Temp 97.8 F (36.6 C) (Oral)   Ht 5\' 9"  (1.753 m)   Wt 187 lb 3.2 oz (84.9 kg)   BMI 27.64 kg/m  Gen: NAD, alert, cooperative with exam HEENT: NCAT CV: RRR, good S1/S2, no murmur Resp: CTABL, no wheezes, non-labored Ext: No edema, warm Neuro: Alert and oriented, No gross deficits  GAD 7 : Generalized Anxiety Score 10/23/2016  Nervous, Anxious, on Edge 3  Control/stop worrying 3  Worry too much - different things 3  Trouble relaxing 2  Restless 3  Easily annoyed or irritable 3  Afraid - awful might happen 1  Total GAD 7 Score 18   Depression screen [facility] 2/9 10/23/2016 10/10/2016 09/16/2015 09/09/2015 08/26/2015  Decreased Interest 0 0 3 3 3   Down, Depressed, Hopeless 0 0 1 1 1   PHQ - 2 Score 0 0 4 4 4   Altered sleeping - - 1 1 1   Tired, decreased energy - - 1 1 1   Change in appetite - - 1 1 1   Feeling bad or failure about yourself  - - 1 1 0  Trouble concentrating - - 1 0 1  Moving slowly or fidgety/restless - - 0 1 0  Suicidal thoughts - - 1 0 0  PHQ-9 Score - - 10 9 8   Difficult doing work/chores - - - Somewhat difficult Somewhat difficult     Assessment and plan:  # Anxiety Depression history on several SSRIs with no improvement. I think there is a moderate chance that he has not used medications 2 along with extent to get benefit. Given his symptoms and nonresponse to previous SSRIs I will try a trial of gabapentin for anxiolytic effect. Discussed titrating dose. Return to clinic in 3-4  weeks or as needed.  Also consider buspirone, or Cymbalta.    Meds ordered this encounter  Medications  . gabapentin (NEURONTIN) 300 MG capsule    Sig: Take 1 capsule (300 mg total) by mouth 3 (three) times daily.    Dispense:  90 capsule    Refill:  3    Murtis Sink, MD [place] [facility] 10/23/2016, 5:01 PM

## 2016-10-23 NOTE — Patient Instructions (Signed)
Great to see you!  Let me know how you are doing with the medication. There are plenty of other options.

## 2016-11-06 ENCOUNTER — Other Ambulatory Visit: Payer: Self-pay | Admitting: *Deleted

## 2016-11-06 ENCOUNTER — Telehealth: Payer: Self-pay | Admitting: *Deleted

## 2016-11-06 MED ORDER — DULOXETINE HCL 60 MG PO CPEP: 60.0000 mg | ORAL_CAPSULE | Freq: Every day | ORAL | 2 refills | Status: DC

## 2016-11-06 NOTE — Telephone Encounter (Signed)
Discontinue gabapentin , start cymbalta.    This one is low and slow so I would not expect results for 2-6 weeks.   Murtis SinkSam Bradshaw, MD [facility] 11/06/2016, 2:35 PM

## 2016-11-06 NOTE — Telephone Encounter (Signed)
Aware. 

## 2017-08-31 ENCOUNTER — Ambulatory Visit: Payer: Self-pay | Admitting: Physician Assistant

## 2017-09-21 ENCOUNTER — Other Ambulatory Visit: Payer: Self-pay | Admitting: Family Medicine

## 2017-09-29 ENCOUNTER — Other Ambulatory Visit: Payer: Self-pay | Admitting: *Deleted

## 2017-09-29 ENCOUNTER — Other Ambulatory Visit: Payer: Self-pay | Admitting: Family Medicine

## 2017-09-29 MED ORDER — DULOXETINE HCL 60 MG PO CPEP: 60.0000 mg | ORAL_CAPSULE | Freq: Every day | ORAL | 2 refills | Status: DC

## 2017-09-29 NOTE — Telephone Encounter (Signed)
Patient requesting refill of Cymbalta sent to the Drug store. Last seen 8/24/18[name] patient switching to Hector Mcmillan. Patient aware ntbs- Please refill and when ntbs

## 2017-11-19 ENCOUNTER — Ambulatory Visit (INDEPENDENT_AMBULATORY_CARE_PROVIDER_SITE_OTHER): Payer: Self-pay | Admitting: Physician Assistant

## 2017-11-19 ENCOUNTER — Encounter: Payer: Self-pay | Admitting: Physician Assistant

## 2017-11-19 VITALS — BP 112/73 | HR 77 | Temp 97.5°F | Ht 69.0 in | Wt 165.4 lb

## 2017-11-19 DIAGNOSIS — F419 Anxiety disorder, unspecified: Secondary | ICD-10-CM

## 2017-11-19 DIAGNOSIS — R1011 Right upper quadrant pain: Secondary | ICD-10-CM

## 2017-11-19 MED ORDER — ALPRAZOLAM 0.5 MG PO TABS: 0.5000 mg | ORAL_TABLET | Freq: Two times a day (BID) | ORAL | 0 refills | Status: DC | PRN

## 2017-11-19 MED ORDER — DULOXETINE HCL 60 MG PO CPEP: 60.0000 mg | ORAL_CAPSULE | Freq: Every day | ORAL | 5 refills | Status: DC

## 2017-11-19 NOTE — Progress Notes (Signed)
BP 112/73   Pulse 77   Temp (!) 97.5 F (36.4 C) (Oral)   Ht _0  (1.753 m)   Wt 165 lb 6.4 oz (75 kg)   BMI 24.43 kg/m    Subjective:    Patient ID: Hector Mcmillan, male    DOB: 1991/01/29, 29 y.o.   MRN: [id number]  HPI: Hector Mcmillan is a 27 y.o. male presenting on 11/19/2017 for Establish Care Wendi Snipes pt- Patient also c/o on and off liver pain ) and Anxiety (Patient states that he thinks the cymbalta is working good but feels he needs soemthing for PRN due to him still feeling like a pannic attack is going to happen sometimes.  If he misses a dose of medication he feels shaky and very anxious the next day.) Patient comes in for recheck on his chronic medical condition.  He has been on Cymbalta for some time to reduce any anxiety and depression he reports doing very well with it.  He is currently on 60 mg.  He states that at times he will have right upper quadrant awareness.  He denies that it is painful.  He has no changes in his bowel or bladder function.  He does not drink alcohol very much but he notices that if he drinks a couple of years the next day or 2 is when he has this sensation.  He reports a few years ago drinking very consistently for about a year and a half.  He denies any other abnormalities no fever or chills, no skin or color changes no change to bowel color.   History reviewed. No pertinent past medical history. Relevant past medical, surgical, family and social history reviewed and updated as indicated. Interim medical history since our last visit reviewed. Allergies and medications reviewed and updated. DATA REVIEWED: CHART IN EPIC  Family History reviewed for pertinent findings.  Review of Systems  Constitutional: Negative.  Negative for appetite change and fatigue.  HENT: Negative.   Eyes: Negative.  Negative for pain and visual disturbance.  Respiratory: Negative.  Negative for cough, chest tightness, shortness of breath and wheezing.   Cardiovascular:  Negative.  Negative for chest pain, palpitations and leg swelling.  Gastrointestinal: Positive for abdominal pain. Negative for blood in stool, constipation, diarrhea, nausea and vomiting.  Endocrine: Negative.   Genitourinary: Negative.   Musculoskeletal: Negative.   Skin: Negative.  Negative for color change and rash.  Neurological: Negative.  Negative for weakness, numbness and headaches.  Psychiatric/Behavioral: Negative for sleep disturbance and suicidal ideas. The patient is nervous/anxious.     Allergies as of 11/19/2017   No Known Allergies     Medication List        Accurate as of 11/19/17 11:59 PM. Always use your most recent med list.          ALPRAZolam 0.5 MG tablet Commonly known as:  XANAX Take 1 tablet (0.5 mg total) by mouth 2 (two) times daily as needed for anxiety.   DULoxetine 60 MG capsule Commonly known as:  CYMBALTA Take 1 capsule (60 mg total) by mouth daily.          Objective:    BP 112/73   Pulse 77   Temp (!) 97.5 F (36.4 C) (Oral)   Ht _1  (1.753 m)   Wt 165 lb 6.4 oz (75 kg)   BMI 24.43 kg/m   No Known Allergies  Wt Readings from Last 3 Encounters:  11/19/17 165 lb 6.4  oz (75 kg)  10/23/16 187 lb 3.2 oz (84.9 kg)  10/10/16 185 lb (83.9 kg)    Physical Exam  Constitutional: He appears well-developed and well-nourished. No distress.  HENT:  Head: Normocephalic and atraumatic.  Eyes: Pupils are equal, round, and reactive to light. Conjunctivae and EOM are normal.  Cardiovascular: Normal rate, regular rhythm and normal heart sounds.  Pulmonary/Chest: Effort normal and breath sounds normal. No respiratory distress.  Skin: Skin is warm and dry.  Psychiatric: He has a normal mood and affect. His behavior is normal.  Nursing note and vitals reviewed.   Results for orders placed or performed in visit on 11/19/17  CBC with Differential/Platelet  Result Value Ref Range   WBC 6.3 3.4 - 10.8 x10E3/uL   RBC 4.88 4.14 - 5.80 x10E6/uL     Hemoglobin 14.4 13.0 - 17.7 g/dL   Hematocrit 43.9 37.5 - 51.0 %   MCV 90 79 - 97 fL   MCH 29.5 26.6 - 33.0 pg   MCHC 32.8 31.5 - 35.7 g/dL   RDW 14.0 12.3 - 15.4 %   Platelets 234 150 - 450 x10E3/uL   Neutrophils 53 Not Estab. %   Lymphs 45 Not Estab. %   Monocytes 0 Not Estab. %   Eos 1 Not Estab. %   Basos 1 Not Estab. %   Neutrophils Absolute 3.4 1.4 - 7.0 x10E3/uL   Lymphocytes Absolute 2.8 0.7 - 3.1 x10E3/uL   Monocytes Absolute 0.0 (L) 0.1 - 0.9 x10E3/uL   EOS (ABSOLUTE) 0.1 0.0 - 0.4 x10E3/uL   Basophils Absolute 0.0 0.0 - 0.2 x10E3/uL   Immature Granulocytes 0 Not Estab. %   Immature Grans (Abs) 0.0 0.0 - 0.1 x10E3/uL  CMP14+EGFR  Result Value Ref Range   Glucose 93 65 - 99 mg/dL   BUN 19 6 - 20 mg/dL   Creatinine, Ser 1.10 0.76 - 1.27 mg/dL   GFR calc non Af Amer 90 >59 mL/min/1.73   GFR calc Af Amer 104 >59 mL/min/1.73   BUN/Creatinine Ratio 17 9 - 20   Sodium 144 134 - 144 mmol/L   Potassium 4.2 3.5 - 5.2 mmol/L   Chloride 103 96 - 106 mmol/L   CO2 23 20 - 29 mmol/L   Calcium 9.8 8.7 - 10.2 mg/dL   Total Protein 7.0 6.0 - 8.5 g/dL   Albumin 5.0 3.5 - 5.5 g/dL   Globulin, Total 2.0 1.5 - 4.5 g/dL   Albumin/Globulin Ratio 2.5 (H) 1.2 - 2.2   Bilirubin Total 0.4 0.0 - 1.2 mg/dL   Alkaline Phosphatase 59 39 - 117 IU/L   AST 18 0 - 40 IU/L   ALT 18 0 - 44 IU/L      Assessment & Plan:   1. Right upper quadrant abdominal pain - CBC with Differential/Platelet - CMP14+EGFR  2. Anxiety - ALPRAZolam (XANAX) 0.5 MG tablet; Take 1 tablet (0.5 mg total) by mouth 2 (two) times daily as needed for anxiety.  Dispense: 30 tablet; Refill: 0 - DULoxetine (CYMBALTA) 60 MG capsule; Take 1 capsule (60 mg total) by mouth daily.  Dispense: 30 capsule; Refill: 5   Continue all other maintenance medications as listed above.  Follow up plan: Return in about 6 months (around 05/20/2018) for recheck.  Educational handout given for [place] PA-C [place] [address]  [place], [place] [address] [phone]   11/23/2017, 1:07 PM

## 2017-11-20 LAB — CBC WITH DIFFERENTIAL/PLATELET
BASOS: 1 %
Basophils Absolute: 0 10*3/uL (ref 0.0–0.2)
EOS (ABSOLUTE): 0.1 10*3/uL (ref 0.0–0.4)
EOS: 1 %
HEMOGLOBIN: 14.4 g/dL (ref 13.0–17.7)
Hematocrit: 43.9 % (ref 37.5–51.0)
IMMATURE GRANS (ABS): 0 10*3/uL (ref 0.0–0.1)
IMMATURE GRANULOCYTES: 0 %
LYMPHS: 45 %
Lymphocytes Absolute: 2.8 10*3/uL (ref 0.7–3.1)
MCH: 29.5 pg (ref 26.6–33.0)
MCHC: 32.8 g/dL (ref 31.5–35.7)
MCV: 90 fL (ref 79–97)
MONOCYTES: 0 %
Monocytes Absolute: 0 10*3/uL — ABNORMAL LOW (ref 0.1–0.9)
Neutrophils Absolute: 3.4 10*3/uL (ref 1.4–7.0)
Neutrophils: 53 %
Platelets: 234 10*3/uL (ref 150–450)
RBC: 4.88 x10E6/uL (ref 4.14–5.80)
RDW: 14 % (ref 12.3–15.4)
WBC: 6.3 10*3/uL (ref 3.4–10.8)

## 2017-11-20 LAB — CMP14+EGFR
ALBUMIN: 5 g/dL (ref 3.5–5.5)
ALT: 18 IU/L (ref 0–44)
AST: 18 IU/L (ref 0–40)
Albumin/Globulin Ratio: 2.5 — ABNORMAL HIGH (ref 1.2–2.2)
Alkaline Phosphatase: 59 IU/L (ref 39–117)
BUN/Creatinine Ratio: 17 (ref 9–20)
BUN: 19 mg/dL (ref 6–20)
Bilirubin Total: 0.4 mg/dL (ref 0.0–1.2)
CALCIUM: 9.8 mg/dL (ref 8.7–10.2)
CO2: 23 mmol/L (ref 20–29)
Chloride: 103 mmol/L (ref 96–106)
Creatinine, Ser: 1.1 mg/dL (ref 0.76–1.27)
GFR, EST AFRICAN AMERICAN: 104 mL/min/{1.73_m2} (ref >59)
GFR, EST NON AFRICAN AMERICAN: 90 mL/min/{1.73_m2} (ref >59)
Globulin, Total: 2 g/dL (ref 1.5–4.5)
Glucose: 93 mg/dL (ref 65–99)
Potassium: 4.2 mmol/L (ref 3.5–5.2)
Sodium: 144 mmol/L (ref 134–144)
TOTAL PROTEIN: 7 g/dL (ref 6.0–8.5)

## 2017-11-23 ENCOUNTER — Encounter: Payer: Self-pay | Admitting: Physician Assistant

## 2017-12-10 ENCOUNTER — Ambulatory Visit: Payer: Self-pay | Admitting: Physician Assistant

## 2018-02-07 ENCOUNTER — Other Ambulatory Visit: Payer: Self-pay | Admitting: Physician Assistant

## 2018-02-07 DIAGNOSIS — F419 Anxiety disorder, unspecified: Secondary | ICD-10-CM

## 2018-02-08 NOTE — Telephone Encounter (Signed)
Last seen 11/19/17  Hector Mcmillan

## 2018-03-09 ENCOUNTER — Encounter: Payer: Self-pay | Admitting: Physician Assistant

## 2018-03-09 ENCOUNTER — Ambulatory Visit: Payer: Self-pay | Admitting: Physician Assistant

## 2018-03-09 DIAGNOSIS — F419 Anxiety disorder, unspecified: Secondary | ICD-10-CM

## 2018-03-09 MED ORDER — ALPRAZOLAM 0.5 MG PO TABS: ORAL_TABLET | ORAL | 1 refills | Status: DC

## 2018-03-09 MED ORDER — TRAZODONE HCL 50 MG PO TABS: 50.0000 mg | ORAL_TABLET | Freq: Every day | ORAL | 6 refills | Status: DC

## 2018-03-09 MED ORDER — DULOXETINE HCL 60 MG PO CPEP: 60.0000 mg | ORAL_CAPSULE | Freq: Every day | ORAL | 5 refills | Status: AC

## 2018-03-12 NOTE — Progress Notes (Signed)
BP 114/75   Pulse 88   Temp (!) 97.3 F (36.3 C) (Oral)   Ht '5\' 9"'  (1.753 m)   Wt 175 lb 12.8 oz (79.7 kg)   BMI 25.96 kg/m    Subjective:    Patient ID: Hector Mcmillan, male    DOB: 13-May-1990, 28 y.o.   MRN: [id number]  HPI: Hector Mcmillan is a 28 y.o. male presenting on 03/09/2018 for Anxiety (medication check ) and trouble sleeping  This patient comes in for recheck on his chronic medical conditions.  He is doing well with his medications however he is still having a lot of trouble with sleep.  He takes NyQuil and melatonin at night without a lot of relief.  In the past he had trouble with sleep.  He work night shift.  He also would drink alcohol before bed to try to sleep better.  Is not do this anymore.  History reviewed. No pertinent past medical history. Relevant past medical, surgical, family and social history reviewed and updated as indicated. Interim medical history since our last visit reviewed. Allergies and medications reviewed and updated. DATA REVIEWED: CHART IN EPIC  Family History reviewed for pertinent findings.  Review of Systems  Constitutional: Negative.  Negative for appetite change and fatigue.  Eyes: Negative for pain and visual disturbance.  Respiratory: Negative.  Negative for cough, chest tightness, shortness of breath and wheezing.   Cardiovascular: Negative.  Negative for chest pain, palpitations and leg swelling.  Gastrointestinal: Negative.  Negative for abdominal pain, diarrhea, nausea and vomiting.  Genitourinary: Negative.   Skin: Negative.  Negative for color change and rash.  Neurological: Negative.  Negative for weakness, numbness and headaches.  Psychiatric/Behavioral: Negative.     Allergies as of 03/09/2018   No Known Allergies     Medication List       Accurate as of March 09, 2018 11:59 PM. Always use your most recent med list.        ALPRAZolam 0.5 MG tablet Commonly known as:  XANAX TAKE 1 TABLET TWICE DAILY AS NEEDED FOR  ANXIETY   DULoxetine 60 MG capsule Commonly known as:  CYMBALTA Take 1 capsule (60 mg total) by mouth daily.   traZODone 50 MG tablet Commonly known as:  DESYREL Take 1-2 tablets (50-100 mg total) by mouth at bedtime.          Objective:    BP 114/75   Pulse 88   Temp (!) 97.3 F (36.3 C) (Oral)   Ht '5\' 9"'  (1.753 m)   Wt 175 lb 12.8 oz (79.7 kg)   BMI 25.96 kg/m   No Known Allergies  Wt Readings from Last 3 Encounters:  03/09/18 175 lb 12.8 oz (79.7 kg)  11/19/17 165 lb 6.4 oz (75 kg)  10/23/16 187 lb 3.2 oz (84.9 kg)    Physical Exam Vitals signs and nursing note reviewed.  Constitutional:      General: He is not in acute distress.    Appearance: He is well-developed.  HENT:     Head: Normocephalic and atraumatic.  Eyes:     Conjunctiva/sclera: Conjunctivae normal.     Pupils: Pupils are equal, round, and reactive to light.  Cardiovascular:     Rate and Rhythm: Normal rate and regular rhythm.     Heart sounds: Normal heart sounds.  Pulmonary:     Effort: Pulmonary effort is normal. No respiratory distress.     Breath sounds: Normal breath sounds.  Skin:  General: Skin is warm and dry.  Psychiatric:        Behavior: Behavior normal.     Results for orders placed or performed in visit on 11/19/17  CBC with Differential/Platelet  Result Value Ref Range   WBC 6.3 3.4 - 10.8 x10E3/uL   RBC 4.88 4.14 - 5.80 x10E6/uL   Hemoglobin 14.4 13.0 - 17.7 g/dL   Hematocrit 43.9 37.5 - 51.0 %   MCV 90 79 - 97 fL   MCH 29.5 26.6 - 33.0 pg   MCHC 32.8 31.5 - 35.7 g/dL   RDW 14.0 12.3 - 15.4 %   Platelets 234 150 - 450 x10E3/uL   Neutrophils 53 Not Estab. %   Lymphs 45 Not Estab. %   Monocytes 0 Not Estab. %   Eos 1 Not Estab. %   Basos 1 Not Estab. %   Neutrophils Absolute 3.4 1.4 - 7.0 x10E3/uL   Lymphocytes Absolute 2.8 0.7 - 3.1 x10E3/uL   Monocytes Absolute 0.0 (L) 0.1 - 0.9 x10E3/uL   EOS (ABSOLUTE) 0.1 0.0 - 0.4 x10E3/uL   Basophils Absolute 0.0 0.0 -  0.2 x10E3/uL   Immature Granulocytes 0 Not Estab. %   Immature Grans (Abs) 0.0 0.0 - 0.1 x10E3/uL  CMP14+EGFR  Result Value Ref Range   Glucose 93 65 - 99 mg/dL   BUN 19 6 - 20 mg/dL   Creatinine, Ser 1.10 0.76 - 1.27 mg/dL   GFR calc non Af Amer 90 >59 mL/min/1.73   GFR calc Af Amer 104 >59 mL/min/1.73   BUN/Creatinine Ratio 17 9 - 20   Sodium 144 134 - 144 mmol/L   Potassium 4.2 3.5 - 5.2 mmol/L   Chloride 103 96 - 106 mmol/L   CO2 23 20 - 29 mmol/L   Calcium 9.8 8.7 - 10.2 mg/dL   Total Protein 7.0 6.0 - 8.5 g/dL   Albumin 5.0 3.5 - 5.5 g/dL   Globulin, Total 2.0 1.5 - 4.5 g/dL   Albumin/Globulin Ratio 2.5 (H) 1.2 - 2.2   Bilirubin Total 0.4 0.0 - 1.2 mg/dL   Alkaline Phosphatase 59 39 - 117 IU/L   AST 18 0 - 40 IU/L   ALT 18 0 - 44 IU/L      Assessment & Plan:   1. Anxiety - ALPRAZolam (XANAX) 0.5 MG tablet; TAKE 1 TABLET TWICE DAILY AS NEEDED FOR ANXIETY  Dispense: 30 tablet; Refill: 1 - DULoxetine (CYMBALTA) 60 MG capsule; Take 1 capsule (60 mg total) by mouth daily.  Dispense: 30 capsule; Refill: 5   Continue all other maintenance medications as listed above.  Follow up plan: No follow-ups on file.  Educational handout given for [place] PA-C [facility] [address]  [place], [place] [address] [phone]   03/12/2018, 11:55 AM

## 2018-05-02 ENCOUNTER — Other Ambulatory Visit: Payer: Self-pay | Admitting: Physician Assistant

## 2018-05-02 DIAGNOSIS — F419 Anxiety disorder, unspecified: Secondary | ICD-10-CM

## 2018-06-15 ENCOUNTER — Other Ambulatory Visit: Payer: Self-pay | Admitting: Physician Assistant

## 2018-06-15 DIAGNOSIS — F419 Anxiety disorder, unspecified: Secondary | ICD-10-CM

## 2018-11-16 ENCOUNTER — Other Ambulatory Visit: Payer: Self-pay | Admitting: Physician Assistant

## 2019-01-02 ENCOUNTER — Other Ambulatory Visit: Payer: Self-pay | Admitting: Physician Assistant

## 2019-01-31 ENCOUNTER — Other Ambulatory Visit: Payer: Self-pay | Admitting: Physician Assistant

## 2019-01-31 DIAGNOSIS — F419 Anxiety disorder, unspecified: Secondary | ICD-10-CM

## 2019-03-06 ENCOUNTER — Other Ambulatory Visit: Payer: Self-pay | Admitting: Physician Assistant

## 2019-03-06 ENCOUNTER — Telehealth: Payer: Self-pay | Admitting: Physician Assistant

## 2019-03-06 DIAGNOSIS — F419 Anxiety disorder, unspecified: Secondary | ICD-10-CM

## 2019-03-06 NOTE — Telephone Encounter (Signed)
Pt called stating that he was just speaking with one of our nurses about his Rx's. Needs to speak with nurse again.

## 2019-03-06 NOTE — Telephone Encounter (Signed)
Advised pt I put a message into his pcp and as soon as she responds we will let him know. Pt voiced understanding.

## 2019-03-06 NOTE — Telephone Encounter (Signed)
I spoke with pt and advised we can't fill the xanax as that is a controlled substance and must be done in office visit. Pt is scheduled to be seen with you 03/24/19 in office. He says he takes the cymbalta and trazodone regularly and I called [facility] in [place] and he did get it filled last 01/31/2019. Can we give 30 day supply for the cymbalta and trazodone to get him to his appt or he needs to wait? Please advise?

## 2019-03-06 NOTE — Telephone Encounter (Signed)
Pt aware he will get refills at his appointment 03/24/19.

## 2019-03-06 NOTE — Telephone Encounter (Signed)
What is the name of the medication? symbalta , xanax , trasadone  Have you contacted your pharmacy to request a refill?yes  Which pharmacy would you like this sent to the drug store   Patient notified that their request is being sent to the clinical staff for review and that they should receive a call once it is complete. If they do not receive a call within 24 hours they can check with their pharmacy or our office.

## 2019-03-23 ENCOUNTER — Telehealth: Payer: Self-pay | Admitting: Physician Assistant

## 2019-03-24 ENCOUNTER — Ambulatory Visit: Payer: Self-pay | Admitting: Physician Assistant

## 2019-03-31 ENCOUNTER — Encounter: Payer: Self-pay | Admitting: Physician Assistant

## 2019-07-28 ENCOUNTER — Emergency Department (HOSPITAL_COMMUNITY)
Admission: EM | Admit: 2019-07-28 | Discharge: 2019-07-28 | Disposition: A | Discharge status: Home / Self Care | Payer: Self-pay | Attending: Emergency Medicine | Admitting: Emergency Medicine

## 2019-07-28 ENCOUNTER — Other Ambulatory Visit: Payer: Self-pay

## 2019-07-28 ENCOUNTER — Encounter (HOSPITAL_COMMUNITY): Payer: Self-pay | Admitting: Emergency Medicine

## 2019-07-28 ENCOUNTER — Emergency Department (HOSPITAL_COMMUNITY): Payer: Self-pay

## 2019-07-28 DIAGNOSIS — Y998 Other external cause status: Secondary | ICD-10-CM | POA: Insufficient documentation

## 2019-07-28 DIAGNOSIS — Z23 Encounter for immunization: Secondary | ICD-10-CM | POA: Insufficient documentation

## 2019-07-28 DIAGNOSIS — S0990XA Unspecified injury of head, initial encounter: Secondary | ICD-10-CM | POA: Insufficient documentation

## 2019-07-28 DIAGNOSIS — S02401A Maxillary fracture, unspecified, initial encounter for closed fracture: Secondary | ICD-10-CM | POA: Insufficient documentation

## 2019-07-28 DIAGNOSIS — Y929 Unspecified place or not applicable: Secondary | ICD-10-CM | POA: Insufficient documentation

## 2019-07-28 DIAGNOSIS — Y9389 Activity, other specified: Secondary | ICD-10-CM | POA: Insufficient documentation

## 2019-07-28 DIAGNOSIS — Z79899 Other long term (current) drug therapy: Secondary | ICD-10-CM | POA: Insufficient documentation

## 2019-07-28 MED ORDER — OXYCODONE-ACETAMINOPHEN 5-325 MG PO TABS
1.0000 | ORAL_TABLET | Freq: Once | ORAL | Status: AC
  Administered 2019-07-28: 1 via ORAL
  Filled 2019-07-28: qty 1

## 2019-07-28 MED ORDER — TETANUS-DIPHTH-ACELL PERTUSSIS 5-2.5-18.5 LF-MCG/0.5 IM SUSP
0.5000 mL | Freq: Once | INTRAMUSCULAR | Status: AC
  Administered 2019-07-28: 0.5 mL via INTRAMUSCULAR
  Filled 2019-07-28: qty 0.5
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
## 2019-07-28 NOTE — ED Notes (Signed)
Patient transported to CT 

## 2019-07-28 NOTE — ED Notes (Signed)
Verbalized understanding of DC instructions and follow up care with ENT 
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
## 2019-07-28 NOTE — Discharge Instructions (Addendum)
At this time there does not appear to be the presence of an emergent medical condition, however there is always the potential for conditions to change. Please read and follow the below instructions.  Please return to the Emergency Department immediately for any new or worsening symptoms. Please be sure to follow up with your Primary Care Provider within one week regarding your visit today; please call their office to schedule an appointment even if you are feeling better for a follow-up visit. Your CT scans showed a fracture of the anterior and posterior wall of your right sinus.    Please read the additional information packets attached to your discharge summary.  Do not take your medicine if  develop an itchy rash, swelling in your mouth or lips, or difficulty breathing; call 911 and seek immediate emergency medical attention if this occurs.  Note: Portions of this text may have been transcribed using voice recognition software. Every effort was made to ensure accuracy; however, inadvertent computerized transcription errors may still be present.
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
